# Patient Record
Sex: Female | Born: 1950 | Race: White | Hispanic: No | Marital: Married | Smoking: Never smoker
Health system: Southern US, Community
[De-identification: ages and names within clinical notes are randomized; demographics above are authoritative.]

## PROBLEM LIST (undated history)

## (undated) DIAGNOSIS — Z2989 Encounter for other specified prophylactic measures: Secondary | ICD-10-CM

## (undated) DIAGNOSIS — Z298 Encounter for other specified prophylactic measures: Secondary | ICD-10-CM

## (undated) HISTORY — DX: Encounter for other specified prophylactic measures: Z29.89

## (undated) HISTORY — DX: Encounter for other specified prophylactic measures: Z29.8

---

## 2000-03-10 ENCOUNTER — Encounter: Payer: Self-pay | Admitting: Internal Medicine

## 2000-03-10 ENCOUNTER — Ambulatory Visit (HOSPITAL_COMMUNITY): Admission: RE | Admit: 2000-03-10 | Discharge: 2000-03-10 | Payer: Self-pay | Admitting: Internal Medicine

## 2000-05-16 ENCOUNTER — Encounter: Admission: RE | Admit: 2000-05-16 | Discharge: 2000-05-16 | Payer: Self-pay | Admitting: Internal Medicine

## 2000-05-18 ENCOUNTER — Other Ambulatory Visit: Admission: RE | Admit: 2000-05-18 | Discharge: 2000-05-18 | Payer: Self-pay | Admitting: Obstetrics and Gynecology

## 2000-05-19 ENCOUNTER — Encounter (INDEPENDENT_AMBULATORY_CARE_PROVIDER_SITE_OTHER): Payer: Self-pay

## 2000-05-19 ENCOUNTER — Other Ambulatory Visit: Admission: RE | Admit: 2000-05-19 | Discharge: 2000-05-19 | Payer: Self-pay | Admitting: Obstetrics and Gynecology

## 2000-05-30 ENCOUNTER — Encounter: Admission: RE | Admit: 2000-05-30 | Discharge: 2000-05-30 | Payer: Self-pay | Admitting: Internal Medicine

## 2000-06-02 ENCOUNTER — Encounter: Payer: Self-pay | Admitting: Obstetrics and Gynecology

## 2000-06-02 ENCOUNTER — Ambulatory Visit (HOSPITAL_COMMUNITY): Admission: RE | Admit: 2000-06-02 | Discharge: 2000-06-02 | Payer: Self-pay | Admitting: Obstetrics and Gynecology

## 2000-09-14 ENCOUNTER — Ambulatory Visit (HOSPITAL_COMMUNITY): Admission: RE | Admit: 2000-09-14 | Discharge: 2000-09-14 | Payer: Self-pay | Admitting: Obstetrics and Gynecology

## 2000-09-14 ENCOUNTER — Encounter: Payer: Self-pay | Admitting: Obstetrics and Gynecology

## 2000-10-05 ENCOUNTER — Encounter: Payer: Self-pay | Admitting: Obstetrics and Gynecology

## 2000-10-05 ENCOUNTER — Other Ambulatory Visit: Admission: RE | Admit: 2000-10-05 | Discharge: 2000-10-05 | Payer: Self-pay | Admitting: Obstetrics and Gynecology

## 2000-10-05 ENCOUNTER — Encounter (INDEPENDENT_AMBULATORY_CARE_PROVIDER_SITE_OTHER): Payer: Self-pay

## 2000-10-05 ENCOUNTER — Encounter: Admission: RE | Admit: 2000-10-05 | Discharge: 2000-10-05 | Payer: Self-pay | Admitting: Obstetrics and Gynecology

## 2001-03-07 ENCOUNTER — Encounter: Admission: RE | Admit: 2001-03-07 | Discharge: 2001-03-07 | Payer: Self-pay | Admitting: Internal Medicine

## 2001-06-08 ENCOUNTER — Other Ambulatory Visit: Admission: RE | Admit: 2001-06-08 | Discharge: 2001-06-08 | Payer: Self-pay | Admitting: Obstetrics and Gynecology

## 2001-07-13 ENCOUNTER — Encounter: Payer: Self-pay | Admitting: Obstetrics and Gynecology

## 2001-07-13 ENCOUNTER — Encounter: Admission: RE | Admit: 2001-07-13 | Discharge: 2001-07-13 | Payer: Self-pay | Admitting: Obstetrics and Gynecology

## 2003-01-02 ENCOUNTER — Encounter: Admission: RE | Admit: 2003-01-02 | Discharge: 2003-01-02 | Payer: Self-pay | Admitting: Obstetrics and Gynecology

## 2003-01-02 ENCOUNTER — Encounter: Payer: Self-pay | Admitting: Obstetrics and Gynecology

## 2004-01-07 ENCOUNTER — Encounter: Admission: RE | Admit: 2004-01-07 | Discharge: 2004-01-07 | Payer: Self-pay | Admitting: Obstetrics and Gynecology

## 2005-03-11 ENCOUNTER — Encounter: Admission: RE | Admit: 2005-03-11 | Discharge: 2005-03-11 | Payer: Self-pay | Admitting: Obstetrics and Gynecology

## 2005-09-27 ENCOUNTER — Ambulatory Visit (HOSPITAL_COMMUNITY): Admission: RE | Admit: 2005-09-27 | Discharge: 2005-09-27 | Payer: Self-pay | Admitting: Neurology

## 2006-03-11 ENCOUNTER — Ambulatory Visit: Payer: Self-pay | Admitting: Internal Medicine

## 2006-03-17 ENCOUNTER — Ambulatory Visit: Payer: Self-pay | Admitting: Internal Medicine

## 2006-03-24 ENCOUNTER — Encounter: Admission: RE | Admit: 2006-03-24 | Discharge: 2006-03-24 | Payer: Self-pay | Admitting: Obstetrics and Gynecology

## 2006-03-30 ENCOUNTER — Other Ambulatory Visit: Admission: RE | Admit: 2006-03-30 | Discharge: 2006-03-30 | Payer: Self-pay | Admitting: Obstetrics and Gynecology

## 2006-10-17 ENCOUNTER — Ambulatory Visit: Payer: Self-pay | Admitting: Internal Medicine

## 2006-10-17 ENCOUNTER — Encounter (INDEPENDENT_AMBULATORY_CARE_PROVIDER_SITE_OTHER): Payer: Self-pay | Admitting: Hospitalist

## 2006-10-17 LAB — CONVERTED CEMR LAB
Hemoglobin: 12.8 g/dL (ref 12.0–15.0)
MCHC: 33.8 g/dL (ref 30.0–36.0)
RBC: 4.17 M/uL (ref 3.87–5.11)
Sed Rate: 5 mm/hr (ref 0–22)

## 2007-03-29 ENCOUNTER — Encounter: Admission: RE | Admit: 2007-03-29 | Discharge: 2007-03-29 | Payer: Self-pay | Admitting: Obstetrics and Gynecology

## 2007-07-19 ENCOUNTER — Ambulatory Visit (HOSPITAL_COMMUNITY): Admission: RE | Admit: 2007-07-19 | Discharge: 2007-07-19 | Payer: Self-pay | Admitting: Internal Medicine

## 2007-07-19 ENCOUNTER — Encounter: Payer: Self-pay | Admitting: Internal Medicine

## 2007-07-19 DIAGNOSIS — R079 Chest pain, unspecified: Secondary | ICD-10-CM | POA: Insufficient documentation

## 2007-09-08 ENCOUNTER — Ambulatory Visit: Payer: Self-pay | Admitting: Internal Medicine

## 2007-09-15 ENCOUNTER — Encounter: Admission: RE | Admit: 2007-09-15 | Discharge: 2007-09-15 | Payer: Self-pay | Admitting: Internal Medicine

## 2007-10-02 ENCOUNTER — Encounter (INDEPENDENT_AMBULATORY_CARE_PROVIDER_SITE_OTHER): Payer: Self-pay | Admitting: Internal Medicine

## 2008-04-03 LAB — HM COLONOSCOPY: HM Colonoscopy: NORMAL

## 2008-04-05 ENCOUNTER — Ambulatory Visit: Payer: Self-pay | Admitting: Internal Medicine

## 2008-04-05 LAB — CONVERTED CEMR LAB: Creatinine, Urine: 66.6 mg/dL

## 2008-04-10 ENCOUNTER — Encounter: Admission: RE | Admit: 2008-04-10 | Discharge: 2008-04-10 | Payer: Self-pay | Admitting: Obstetrics and Gynecology

## 2008-06-20 ENCOUNTER — Ambulatory Visit: Payer: Self-pay | Admitting: Internal Medicine

## 2008-06-20 DIAGNOSIS — M810 Age-related osteoporosis without current pathological fracture: Secondary | ICD-10-CM

## 2008-06-20 LAB — CONVERTED CEMR LAB: Creatinine, Urine: 57.7 mg/dL

## 2008-12-05 ENCOUNTER — Ambulatory Visit: Payer: Self-pay | Admitting: Internal Medicine

## 2008-12-05 LAB — CONVERTED CEMR LAB
HDL: 74 mg/dL (ref >39)
LDL Cholesterol: 92 mg/dL (ref 0–99)
Triglycerides: 134 mg/dL (ref <150)

## 2009-04-14 ENCOUNTER — Encounter: Admission: RE | Admit: 2009-04-14 | Discharge: 2009-04-14 | Payer: Self-pay | Admitting: Obstetrics and Gynecology

## 2009-11-17 ENCOUNTER — Encounter: Admission: RE | Admit: 2009-11-17 | Discharge: 2009-11-17 | Payer: Self-pay | Admitting: Obstetrics and Gynecology

## 2010-04-03 LAB — HM DEXA SCAN

## 2011-06-26 ENCOUNTER — Other Ambulatory Visit: Payer: Self-pay | Admitting: Internal Medicine

## 2011-06-26 DIAGNOSIS — Z23 Encounter for immunization: Secondary | ICD-10-CM

## 2011-06-26 MED ORDER — ZOSTER VACCINE LIVE 19400 UNT/0.65ML ~~LOC~~ SOLR: 0.6500 mL | Freq: Once | SUBCUTANEOUS | Status: DC

## 2011-10-05 ENCOUNTER — Ambulatory Visit: Payer: Self-pay | Admitting: Ophthalmology

## 2012-08-03 LAB — HM MAMMOGRAPHY: HM Mammogram: NORMAL

## 2012-11-01 ENCOUNTER — Encounter: Payer: Self-pay | Admitting: Internal Medicine

## 2013-04-03 ENCOUNTER — Ambulatory Visit (INDEPENDENT_AMBULATORY_CARE_PROVIDER_SITE_OTHER)
Admission: RE | Admit: 2013-04-03 | Discharge: 2013-04-03 | Disposition: A | Discharge status: Home / Self Care | Payer: PRIVATE HEALTH INSURANCE | Source: Ambulatory Visit | Attending: Internal Medicine | Admitting: Internal Medicine

## 2013-04-03 ENCOUNTER — Ambulatory Visit (INDEPENDENT_AMBULATORY_CARE_PROVIDER_SITE_OTHER)
Admission: RE | Admit: 2013-04-03 | Discharge: 2013-04-03 | Disposition: A | Discharge status: Home / Self Care | Payer: BC Managed Care – PPO | Source: Ambulatory Visit | Attending: Internal Medicine | Admitting: Internal Medicine

## 2013-04-03 ENCOUNTER — Encounter: Payer: Self-pay | Admitting: Internal Medicine

## 2013-04-03 ENCOUNTER — Ambulatory Visit (INDEPENDENT_AMBULATORY_CARE_PROVIDER_SITE_OTHER): Payer: PRIVATE HEALTH INSURANCE | Admitting: Internal Medicine

## 2013-04-03 VITALS — BP 118/66 | HR 64 | Temp 97.9°F | Resp 16 | Ht 67.0 in | Wt 141.8 lb

## 2013-04-03 DIAGNOSIS — Z1211 Encounter for screening for malignant neoplasm of colon: Secondary | ICD-10-CM

## 2013-04-03 DIAGNOSIS — M25549 Pain in joints of unspecified hand: Secondary | ICD-10-CM

## 2013-04-03 DIAGNOSIS — M25542 Pain in joints of left hand: Secondary | ICD-10-CM

## 2013-04-03 DIAGNOSIS — M25541 Pain in joints of right hand: Secondary | ICD-10-CM

## 2013-04-03 DIAGNOSIS — Z1239 Encounter for other screening for malignant neoplasm of breast: Secondary | ICD-10-CM

## 2013-04-03 DIAGNOSIS — M810 Age-related osteoporosis without current pathological fracture: Secondary | ICD-10-CM

## 2013-04-03 MED ORDER — HYDROCODONE-ACETAMINOPHEN 5-325 MG PO TABS: 1.0000 | ORAL_TABLET | Freq: Four times a day (QID) | ORAL | Status: DC | PRN

## 2013-04-03 NOTE — Patient Instructions (Addendum)
Thank you for coming today. 

## 2013-04-03 NOTE — Assessment & Plan Note (Addendum)
Managed for over 10 years with bisphosphonate therapy, now managed with Evista, by Ellie Lunch.  History of prior rib fractures and left humeral fracture. She has a strong FH of osteoporosis.Marland Kitchen

## 2013-04-04 ENCOUNTER — Encounter: Payer: Self-pay | Admitting: Internal Medicine

## 2013-04-04 ENCOUNTER — Telehealth: Payer: Self-pay | Admitting: Internal Medicine

## 2013-04-04 DIAGNOSIS — Z1211 Encounter for screening for malignant neoplasm of colon: Secondary | ICD-10-CM | POA: Insufficient documentation

## 2013-04-04 DIAGNOSIS — Z1239 Encounter for other screening for malignant neoplasm of breast: Secondary | ICD-10-CM | POA: Insufficient documentation

## 2013-04-04 NOTE — Assessment & Plan Note (Signed)
Normal colonoscopy in 2009 with 10 yr follow up advised by Lina Sar.  Recommending annual FOBTS for year 5 through 10

## 2013-04-04 NOTE — Assessment & Plan Note (Signed)
She has pain and stiffness in her DIP joints bilaterally and a cystic appearing area on her index finger that does not involve the joint.  Plain films of both hands are negative for erosive changes and show only changes consistent with OA.  Recommend prn use of NSAIDs

## 2013-04-04 NOTE — Progress Notes (Signed)
Patient ID: Amber Chambers, female   DOB: 07-Apr-1951, 62 y.o.   MRN: [id number]   Patient Active Problem List   Diagnosis Date Noted  . Breast cancer screening 04/04/2013  . Colon cancer screening 04/04/2013  . Joint pain in fingers of right hand 04/03/2013  . Osteoporosis, unspecified 06/20/2008  . RIB PAIN, RIGHT SIDED 07/19/2007    Subjective:  CC:   Chief Complaint  Patient presents with  . Follow-up    HPI:   Amber Chambers is a 62 y.o. female who presents as a new patient (last seen September 2011) to establish primary care with the chief complaint of  Joint pain. Patient has been noticing stiffness and enlargement of the distal IP joints of the fingers 2-4 on both hands for the last several months .  For the last two weeks has had a swollen area on the extensor surface of the tip of the index finger that abuts the cuticle.  The area is minimally tender and does not involve the joint and has grown slightly larger in size since she first noticed it. No history of insect bites, dog bites,  No fevers or rashes.  No history of trauma but does work in her yard gardening around [employer] and other flowering plants   Past Medical History  Diagnosis Date  . Osteoporosis prophylaxis     History reviewed. No pertinent past surgical history.  Family History  Problem Relation Age of Onset  . Cancer Mother   . Cancer Sister   . Early death Neg Hx   . Heart disease Neg Hx     History   Social History  . Marital Status: Married    Spouse Name: Sam Cykert    Number of Children: 2  . Years of Education: 20+   Occupational History  . physician    Social History Main Topics  . Smoking status: Never Smoker   . Smokeless tobacco: Never Used  . Alcohol Use: Yes  . Drug Use: No  . Sexually Active: Yes   Other Topics Concern  . Not on file   Social History Narrative   Physician, Palliative Care , [employer]   No Known Allergies   Review of Systems:   Patient denies headache,  fevers, malaise, unintentional weight loss, skin rash, eye pain, sinus congestion and sinus pain, sore throat, dysphagia,  hemoptysis , cough, dyspnea, wheezing, chest pain, palpitations, orthopnea, edema, abdominal pain, nausea, melena, diarrhea, constipation, flank pain, dysuria, hematuria, urinary  Frequency, nocturia, numbness, tingling, seizures,  Focal weakness, Loss of consciousness,  Tremor, insomnia, depression, anxiety, and suicidal ideation.    Objective:  BP 118/66  Pulse 64  Temp(Src) 97.9 F (36.6 C)  Resp 16  Ht 5\' 7"  (1.702 m)  Wt 141 lb 12 oz (64.297 kg)  BMI 22.2 kg/m2  SpO2 99%  General appearance: alert, cooperative and appears stated age Ears: normal TM's and external ear canals both ears Throat: lips, mucosa, and tongue normal; teeth and gums normal Neck: no adenopathy, no carotid bruit, supple, symmetrical, trachea midline and thyroid not enlarged, symmetric, no tenderness/mass/nodules Back: symmetric, no curvature. ROM normal. No CVA tenderness. Lungs: clear to auscultation bilaterally Heart: regular rate and rhythm, S1, S2 normal, no murmur, click, rub or gallop Abdomen: soft, non-tender; bowel sounds normal; no masses,  no organomegaly Pulses: 2+ and symmetric Skin: Skin color, texture, turgor normal. No rashes or lesions Lymph nodes: Cervical, supraclavicular, and axillary nodes normal.  Assessment and Plan:  Osteoporosis, unspecified  Managed for over 10 years with bisphosphonate therapy, now managed with Evista, by Ellie Lunch.  History of prior rib fractures and left humeral fracture. She has a strong FH of osteoporosis..   Joint pain in fingers of right hand She has pain and stiffness in her DIP joints bilaterally and a cystic appearing area on her index finger that does not involve the joint.  Plain films of both hands are negative for erosive changes and show only changes consistent with OA.  Recommend prn use of NSAIDs   Breast cancer screening With  a strong FH of BRCa,  Surveillance is managed by her gynecologist with annual mammograms,  Prior stereotactic breast biopsy in 1992 was benign.    Colon cancer screening Normal colonoscopy in 2009 with 10 yr follow up advised by Lina Sar.  Recommending annual FOBTS for year 5 through 10    Updated Medication List Outpatient Encounter Prescriptions as of 04/03/2013  Medication Sig Dispense Refill  . FLUoxetine (PROZAC) 10 MG capsule Take 10 mg by mouth daily.      . raloxifene (EVISTA) 60 MG tablet Take 60 mg by mouth daily.      Marland Kitchen zoster vaccine live, PF, (ZOSTAVAX) 40981 UNT/0.65ML injection Inject 19,400 Units into the skin once.  1 vial  0  . HYDROcodone-acetaminophen (NORCO/VICODIN) 5-325 MG per tablet Take 1 tablet by mouth every 6 (six) hours as needed for pain.  120 tablet  3   No facility-administered encounter medications on file as of 04/03/2013.     Orders Placed This Encounter  Procedures  . DG Hand Complete Left  . DG Hand Complete Right  . HM MAMMOGRAPHY  . HM DEXA SCAN  . HM PAP SMEAR  . POC Hemoccult Bld/Stl (3-Cd Home Screen)  . HM COLONOSCOPY    No Follow-up on file.

## 2013-04-04 NOTE — Assessment & Plan Note (Signed)
With a strong FH of BRCa,  Surveillance is managed by her gynecologist with annual mammograms,  Prior stereotactic breast biopsy in 1992 was benign.

## 2013-10-12 LAB — HM MAMMOGRAPHY: HM MAMMO: NORMAL

## 2013-10-22 ENCOUNTER — Encounter: Payer: Self-pay | Admitting: Internal Medicine

## 2013-11-30 ENCOUNTER — Other Ambulatory Visit: Payer: Self-pay | Admitting: Adult Health

## 2013-11-30 MED ORDER — OSELTAMIVIR PHOSPHATE 75 MG PO CAPS: 75.0000 mg | ORAL_CAPSULE | Freq: Two times a day (BID) | ORAL | Status: DC

## 2013-11-30 MED ORDER — AZITHROMYCIN 250 MG PO TABS: ORAL_TABLET | ORAL | Status: DC

## 2013-11-30 NOTE — Progress Notes (Signed)
Influenza household exposure. Now symtoms

## 2014-06-17 ENCOUNTER — Other Ambulatory Visit: Payer: Self-pay | Admitting: Internal Medicine

## 2014-06-17 MED ORDER — RALOXIFENE HCL 60 MG PO TABS: 60.0000 mg | ORAL_TABLET | Freq: Every day | ORAL | Status: DC

## 2014-09-12 ENCOUNTER — Encounter: Payer: Self-pay | Admitting: Internal Medicine

## 2014-09-12 ENCOUNTER — Encounter (INDEPENDENT_AMBULATORY_CARE_PROVIDER_SITE_OTHER): Payer: Self-pay

## 2014-09-12 ENCOUNTER — Ambulatory Visit (INDEPENDENT_AMBULATORY_CARE_PROVIDER_SITE_OTHER): Payer: BC Managed Care – PPO | Admitting: Internal Medicine

## 2014-09-12 VITALS — BP 120/68 | HR 55 | Temp 98.6°F | Resp 16 | Ht 67.0 in | Wt 149.8 lb

## 2014-09-12 DIAGNOSIS — M79641 Pain in right hand: Secondary | ICD-10-CM

## 2014-09-12 DIAGNOSIS — Z23 Encounter for immunization: Secondary | ICD-10-CM

## 2014-09-12 DIAGNOSIS — M25541 Pain in joints of right hand: Secondary | ICD-10-CM

## 2014-09-12 DIAGNOSIS — E876 Hypokalemia: Secondary | ICD-10-CM

## 2014-09-12 DIAGNOSIS — Z0000 Encounter for general adult medical examination without abnormal findings: Secondary | ICD-10-CM

## 2014-09-12 DIAGNOSIS — G2581 Restless legs syndrome: Secondary | ICD-10-CM

## 2014-09-12 DIAGNOSIS — E785 Hyperlipidemia, unspecified: Secondary | ICD-10-CM

## 2014-09-12 DIAGNOSIS — Z1239 Encounter for other screening for malignant neoplasm of breast: Secondary | ICD-10-CM

## 2014-09-12 DIAGNOSIS — M810 Age-related osteoporosis without current pathological fracture: Secondary | ICD-10-CM

## 2014-09-12 LAB — COMPREHENSIVE METABOLIC PANEL
ALT: 16 U/L (ref 0–35)
AST: 19 U/L (ref 0–37)
Albumin: 3.9 g/dL (ref 3.5–5.2)
Alkaline Phosphatase: 39 U/L (ref 39–117)
BUN: 17 mg/dL (ref 6–23)
CALCIUM: 9.2 mg/dL (ref 8.4–10.5)
CO2: 27 meq/L (ref 19–32)
CREATININE: 0.7 mg/dL (ref 0.4–1.2)
Chloride: 105 mEq/L (ref 96–112)
GFR: 86.29 mL/min (ref >60.00)
Glucose, Bld: 120 mg/dL — ABNORMAL HIGH (ref 70–99)
Potassium: 3.4 mEq/L — ABNORMAL LOW (ref 3.5–5.1)
Sodium: 140 mEq/L (ref 135–145)
Total Bilirubin: 0.5 mg/dL (ref 0.2–1.2)
Total Protein: 6.4 g/dL (ref 6.0–8.3)

## 2014-09-12 LAB — TSH: TSH: 0.94 u[IU]/mL (ref 0.35–4.50)

## 2014-09-12 LAB — VITAMIN D 25 HYDROXY (VIT D DEFICIENCY, FRACTURES): VITD: 33.73 ng/mL (ref 30.00–100.00)

## 2014-09-12 LAB — FERRITIN: Ferritin: 135.3 ng/mL (ref 10.0–291.0)

## 2014-09-12 MED ORDER — HYDROCODONE-ACETAMINOPHEN 5-325 MG PO TABS: 1.0000 | ORAL_TABLET | ORAL | Status: DC | PRN

## 2014-09-12 NOTE — Assessment & Plan Note (Addendum)
Managed for 10 years with bisphosphonate therapy previously , now managed with Evista, by Ellie LunchBhakti Paul.  History of prior rib fractures and left humeral fracture. She has a strong FH of osteoporosis..  Dr Renae FicklePaul [facility] dic[facility] who advise continuing Evista . Vit D level is normal. Discussed the current controversies surrounding the risks and benefits of calcium supplementation.  Encouraged her to increase dietary calcium through natural foods including almond/coconut milk
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
## 2014-09-12 NOTE — Assessment & Plan Note (Signed)
workjuyp was nwfgijed

## 2014-09-12 NOTE — Progress Notes (Signed)
Pre-visit discussion using our clinic review tool. No additional management support is needed unless otherwise documented below in the visit note.  

## 2014-09-12 NOTE — Patient Instructions (Addendum)
You had your annual  wellness exam today.    You can schedule  your mammogram at [facility] in December .   You received the TDaP vaccine and the Pneumovax vaccine today.  If you develop a sore arm , use tylenol and ibuprofen for a few days   We will contact you with the bloodwork results  I recommend getting the majority of your calcium and Vitamin D  through diet rather than supplements given the recent association of calcium supplements with increased coronary artery calcium scores (You need 1800 mg daily )   Unsweetened almond/coconut milk is a great low calorie , low carb, cholesterol free  way to increase your dietary calcium and vitamin D.  Try the blue [facility] Maintenance Adopting a healthy lifestyle and getting preventive care can go a long way to promote health and wellness. Talk with your health care provider about what schedule of regular examinations is right for you. This is a good chance for you to check in with your provider about disease prevention and staying healthy. In between checkups, there are plenty of things you can do on your own. Experts have done a lot of research about which lifestyle changes and preventive measures are most likely to keep you healthy. Ask your health care provider for more information. WEIGHT AND DIET  Eat a healthy diet  Be sure to include plenty of vegetables, fruits, low-fat dairy products, and lean protein.  Do not eat a lot of foods high in solid fats, added sugars, or salt.  Get regular exercise. This is one of the most important things you can do for your health.  Most adults should exercise for at least 150 minutes each week. The exercise should increase your heart rate and make you sweat (moderate-intensity exercise).  Most adults should also do strengthening exercises at least twice a week. This is in addition to the moderate-intensity exercise.  Maintain a healthy weight  Body mass index (BMI) is a measurement that can  be used to identify possible weight problems. It estimates body fat based on height and weight. Your health care provider can help determine your BMI and help you achieve or maintain a healthy weight.  For females 81 years of age and older:   A BMI below 18.5 is considered underweight.  A BMI of 18.5 to 24.9 is normal.  A BMI of 25 to 29.9 is considered overweight.  A BMI of 30 and above is considered obese.  Watch levels of cholesterol and blood lipids  You should start having your blood tested for lipids and cholesterol at 65 years of age, then have this test every 5 years.  You may need to have your cholesterol levels checked more often if:  Your lipid or cholesterol levels are high.  You are older than 65 years of age.  You are at high risk for heart disease.  CANCER SCREENING   Lung Cancer  Lung cancer screening is recommended for adults 42-54 years old who are at high risk for lung cancer because of a history of smoking.  A yearly low-dose CT scan of the lungs is recommended for people who:  Currently smoke.  Have quit within the past 15 years.  Have at least a 30-pack-year history of smoking. A pack year is smoking an average of one pack of cigarettes a day for 1 year.  Yearly screening should continue until it has been 15 years since you quit.  Yearly screening should  stop if you develop a health problem that would prevent you from having lung cancer treatment.  Breast Cancer  Practice breast self-awareness. This means understanding how your breasts normally appear and feel.  It also means doing regular breast self-exams. Let your health care provider know about any changes, no matter how small.  If you are in your 20s or 30s, you should have a clinical breast exam (CBE) by a health care provider every 1-3 years as part of a regular health exam.  If you are 59 or older, have a CBE every year. Also consider having a breast X-ray (mammogram) every year.  If  you have a family history of breast cancer, talk to your health care provider about genetic screening.  If you are at high risk for breast cancer, talk to your health care provider about having an MRI and a mammogram every year.  Breast cancer gene (BRCA) assessment is recommended for women who have family members with BRCA-related cancers. BRCA-related cancers include:  Breast.  Ovarian.  Tubal.  Peritoneal cancers.  Results of the assessment will determine the need for genetic counseling and BRCA1 and BRCA2 testing. Cervical Cancer Routine pelvic examinations to screen for cervical cancer are no longer recommended for nonpregnant women who are considered low risk for cancer of the pelvic organs (ovaries, uterus, and vagina) and who do not have symptoms. A pelvic examination may be necessary if you have symptoms including those associated with pelvic infections. Ask your health care provider if a screening pelvic exam is right for you.   The Pap test is the screening test for cervical cancer for women who are considered at risk.  If you had a hysterectomy for a problem that was not cancer or a condition that could lead to cancer, then you no longer need Pap tests.  If you are older than 65 years, and you have had normal Pap tests for the past 10 years, you no longer need to have Pap tests.  If you have had past treatment for cervical cancer or a condition that could lead to cancer, you need Pap tests and screening for cancer for at least 20 years after your treatment.  If you no longer get a Pap test, assess your risk factors if they change (such as having a new sexual partner). This can affect whether you should start being screened again.  Some women have medical problems that increase their chance of getting cervical cancer. If this is the case for you, your health care provider may recommend more frequent screening and Pap tests.  The human papillomavirus (HPV) test is another test  that may be used for cervical cancer screening. The HPV test looks for the virus that can cause cell changes in the cervix. The cells collected during the Pap test can be tested for HPV.  The HPV test can be used to screen women 31 years of age and older. Getting tested for HPV can extend the interval between normal Pap tests from three to five years.  An HPV test also should be used to screen women of any age who have unclear Pap test results.  After 65 years of age, women should have HPV testing as often as Pap tests.  Colorectal Cancer  This type of cancer can be detected and often prevented.  Routine colorectal cancer screening usually begins at 65 years of age and continues through 65 years of age.  Your health care provider may recommend screening at an earlier age  if you have risk factors for colon cancer.  Your health care provider may also recommend using home test kits to check for hidden blood in the stool.  A small camera at the end of a tube can be used to examine your colon directly (sigmoidoscopy or colonoscopy). This is done to check for the earliest forms of colorectal cancer.  Routine screening usually begins at age 10.  Direct examination of the colon should be repeated every 5-10 years through 65 years of age. However, you may need to be screened more often if early forms of precancerous polyps or small growths are found. Skin Cancer  Check your skin from head to toe regularly.  Tell your health care provider about any new moles or changes in moles, especially if there is a change in a mole's shape or color.  Also tell your health care provider if you have a mole that is larger than the size of a pencil eraser.  Always use sunscreen. Apply sunscreen liberally and repeatedly throughout the day.  Protect yourself by wearing long sleeves, pants, a wide-brimmed hat, and sunglasses whenever you are outside. HEART DISEASE, DIABETES, AND HIGH BLOOD PRESSURE   Have  your blood pressure checked at least every 1-2 years. High blood pressure causes heart disease and increases the risk of stroke.  If you are between 53 years and 51 years old, ask your health care provider if you should take aspirin to prevent strokes.  Have regular diabetes screenings. This involves taking a blood sample to check your fasting blood sugar level.  If you are at a normal weight and have a low risk for diabetes, have this test once every three years after 65 years of age.  If you are overweight and have a high risk for diabetes, consider being tested at a younger age or more often. PREVENTING INFECTION  Hepatitis B  If you have a higher risk for hepatitis B, you should be screened for this virus. You are considered at high risk for hepatitis B if:  You were born in a country where hepatitis B is common. Ask your health care provider which countries are considered high risk.  Your parents were born in a high-risk country, and you have not been immunized against hepatitis B (hepatitis B vaccine).  You have HIV or AIDS.  You use needles to inject street drugs.  You live with someone who has hepatitis B.  You have had sex with someone who has hepatitis B.  You get hemodialysis treatment.  You take certain medicines for conditions, including cancer, organ transplantation, and autoimmune conditions. Hepatitis C  Blood testing is recommended for:  Everyone born from [birth year] through 1967.  Anyone with known risk factors for hepatitis C. Sexually transmitted infections (STIs)  You should be screened for sexually transmitted infections (STIs) including gonorrhea and chlamydia if:  You are sexually active and are younger than 65 years of age.  You are older than 65 years of age and your health care provider tells you that you are at risk for this type of infection.  Your sexual activity has changed since you were last screened and you are at an increased risk for chlamydia  or gonorrhea. Ask your health care provider if you are at risk.  If you do not have HIV, but are at risk, it may be recommended that you take a prescription medicine daily to prevent HIV infection. This is called pre-exposure prophylaxis (PrEP). You are considered at risk if:  You are sexually active and do not regularly use condoms or know the HIV status of your partner(s).  You take drugs by injection.  You are sexually active with a partner who has HIV. Talk with your health care provider about whether you are at high risk of being infected with HIV. If you choose to begin PrEP, you should first be tested for HIV. You should then be tested every 3 months for as long as you are taking PrEP.  PREGNANCY   If you are premenopausal and you may become pregnant, ask your health care provider about preconception counseling.  If you may become pregnant, take 400 to 800 micrograms (mcg) of folic acid every day.  If you want to prevent pregnancy, talk to your health care provider about birth control (contraception). OSTEOPOROSIS AND MENOPAUSE   Osteoporosis is a disease in which the bones lose minerals and strength with aging. This can result in serious bone fractures. Your risk for osteoporosis can be identified using a bone density scan.  If you are 49 years of age or older, or if you are at risk for osteoporosis and fractures, ask your health care provider if you should be screened.  Ask your health care provider whether you should take a calcium or vitamin D supplement to lower your risk for osteoporosis.  Menopause may have certain physical symptoms and risks.  Hormone replacement therapy may reduce some of these symptoms and risks. Talk to your health care provider about whether hormone replacement therapy is right for you.  HOME CARE INSTRUCTIONS   Schedule regular health, dental, and eye exams.  Stay current with your immunizations.   Do not use any tobacco products including  cigarettes, chewing tobacco, or electronic cigarettes.  If you are pregnant, do not drink alcohol.  If you are breastfeeding, limit how much and how often you drink alcohol.  Limit alcohol intake to no more than 1 drink per day for nonpregnant women. One drink equals 12 ounces of beer, 5 ounces of wine, or 1 ounces of hard liquor.  Do not use street drugs.  Do not share needles.  Ask your health care provider for help if you need support or information about quitting drugs.  Tell your health care provider if you often feel depressed.  Tell your health care provider if you have ever been abused or do not feel safe at home. Document Released: 04/26/2011 Document Revised: 02/25/2014 Document Reviewed: 09/12/2013 [facility] Patient Information 2015 [place], [place]. This information is not intended to replace advice given to you by your health care provider. Make sure you discuss any questions you have with your health care provider.

## 2014-09-13 LAB — IRON AND TIBC
%SAT: 24 % (ref 20–55)
IRON: 62 ug/dL (ref 42–145)
TIBC: 254 ug/dL (ref 250–470)
UIBC: 192 ug/dL (ref 125–400)

## 2014-09-13 LAB — LIPID PANEL
CHOL/HDL RATIO: 3
CHOLESTEROL: 238 mg/dL — AB (ref 0–200)
HDL: 80.4 mg/dL (ref >39.00)
LDL CALC: 135 mg/dL — AB (ref 0–99)
NONHDL: 157.6
Triglycerides: 111 mg/dL (ref 0.0–149.0)
VLDL: 22.2 mg/dL (ref 0.0–40.0)

## 2014-09-14 ENCOUNTER — Encounter: Payer: Self-pay | Admitting: Internal Medicine

## 2014-09-14 DIAGNOSIS — Z0000 Encounter for general adult medical examination without abnormal findings: Secondary | ICD-10-CM | POA: Insufficient documentation

## 2014-09-14 DIAGNOSIS — G2581 Restless legs syndrome: Secondary | ICD-10-CM | POA: Insufficient documentation

## 2014-09-14 DIAGNOSIS — E876 Hypokalemia: Secondary | ICD-10-CM | POA: Insufficient documentation

## 2014-09-14 NOTE — Assessment & Plan Note (Signed)
[facility]

## 2014-09-14 NOTE — Assessment & Plan Note (Signed)

## 2014-09-14 NOTE — Assessment & Plan Note (Signed)
Mild, etiology unclear.  checking mg level.

## 2014-09-14 NOTE — Assessment & Plan Note (Signed)
Only occuring on weekdays,  With no history of low back pain .  Continue prn vicodin which relieves symptoms.  Iron studies are normal.

## 2014-09-15 NOTE — Addendum Note (Signed)
Addended by: Sherlene ShamsULLO, Rayshawn Maney L on: 09/15/2014 09:14 AM   Modules accepted: Orders

## 2014-09-16 ENCOUNTER — Encounter: Payer: Self-pay | Admitting: Internal Medicine

## 2014-09-16 ENCOUNTER — Other Ambulatory Visit (INDEPENDENT_AMBULATORY_CARE_PROVIDER_SITE_OTHER): Payer: BC Managed Care – PPO

## 2014-09-16 DIAGNOSIS — E876 Hypokalemia: Secondary | ICD-10-CM

## 2014-09-16 LAB — MAGNESIUM: MAGNESIUM: 2.1 mg/dL (ref 1.5–2.5)

## 2014-11-07 LAB — HM MAMMOGRAPHY

## 2015-02-22 IMAGING — CR DG HAND COMPLETE 3+V*L*
3 series · 3 of 3 positions shown · non-contrast
Comparison: None

CLINICAL DATA: Joint pain in the fingers.  Arthritis.  Nodules.

LEFT HAND - COMPLETE 3+ VIEW

[view not recorded (1 of 3)]
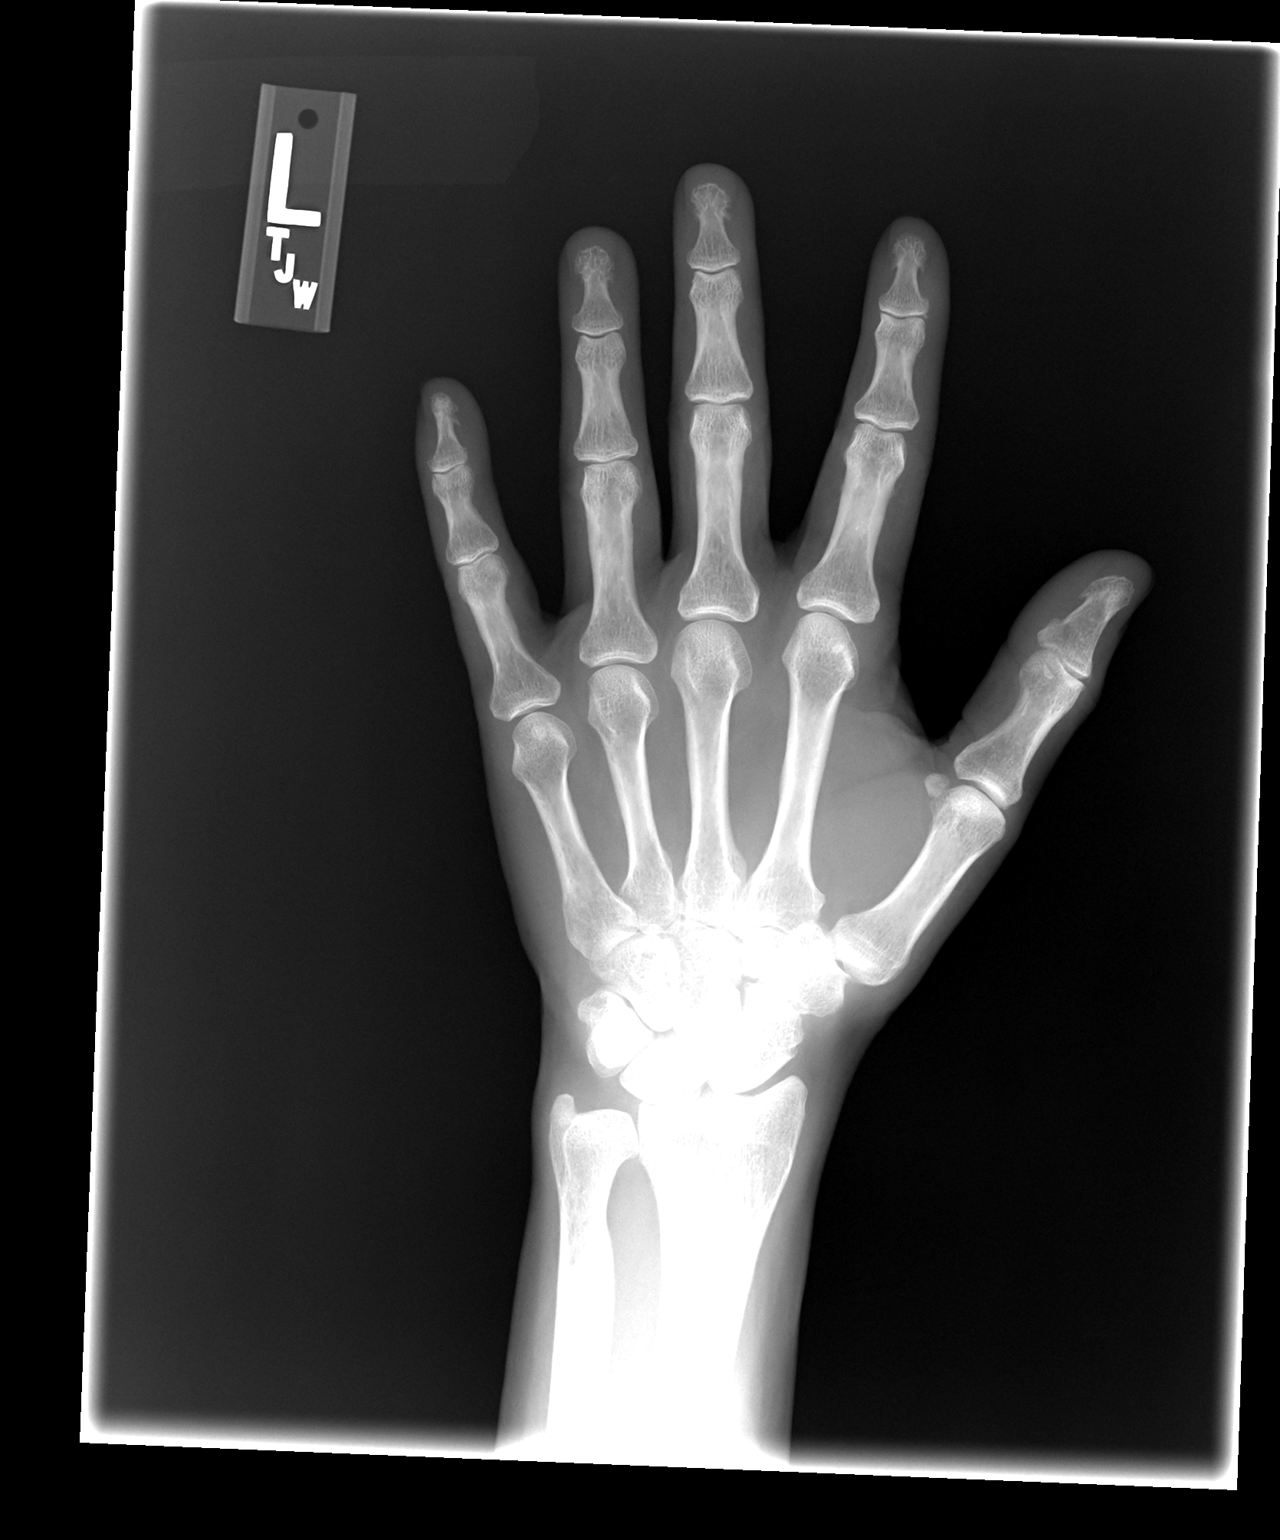

[view not recorded (2 of 3)]
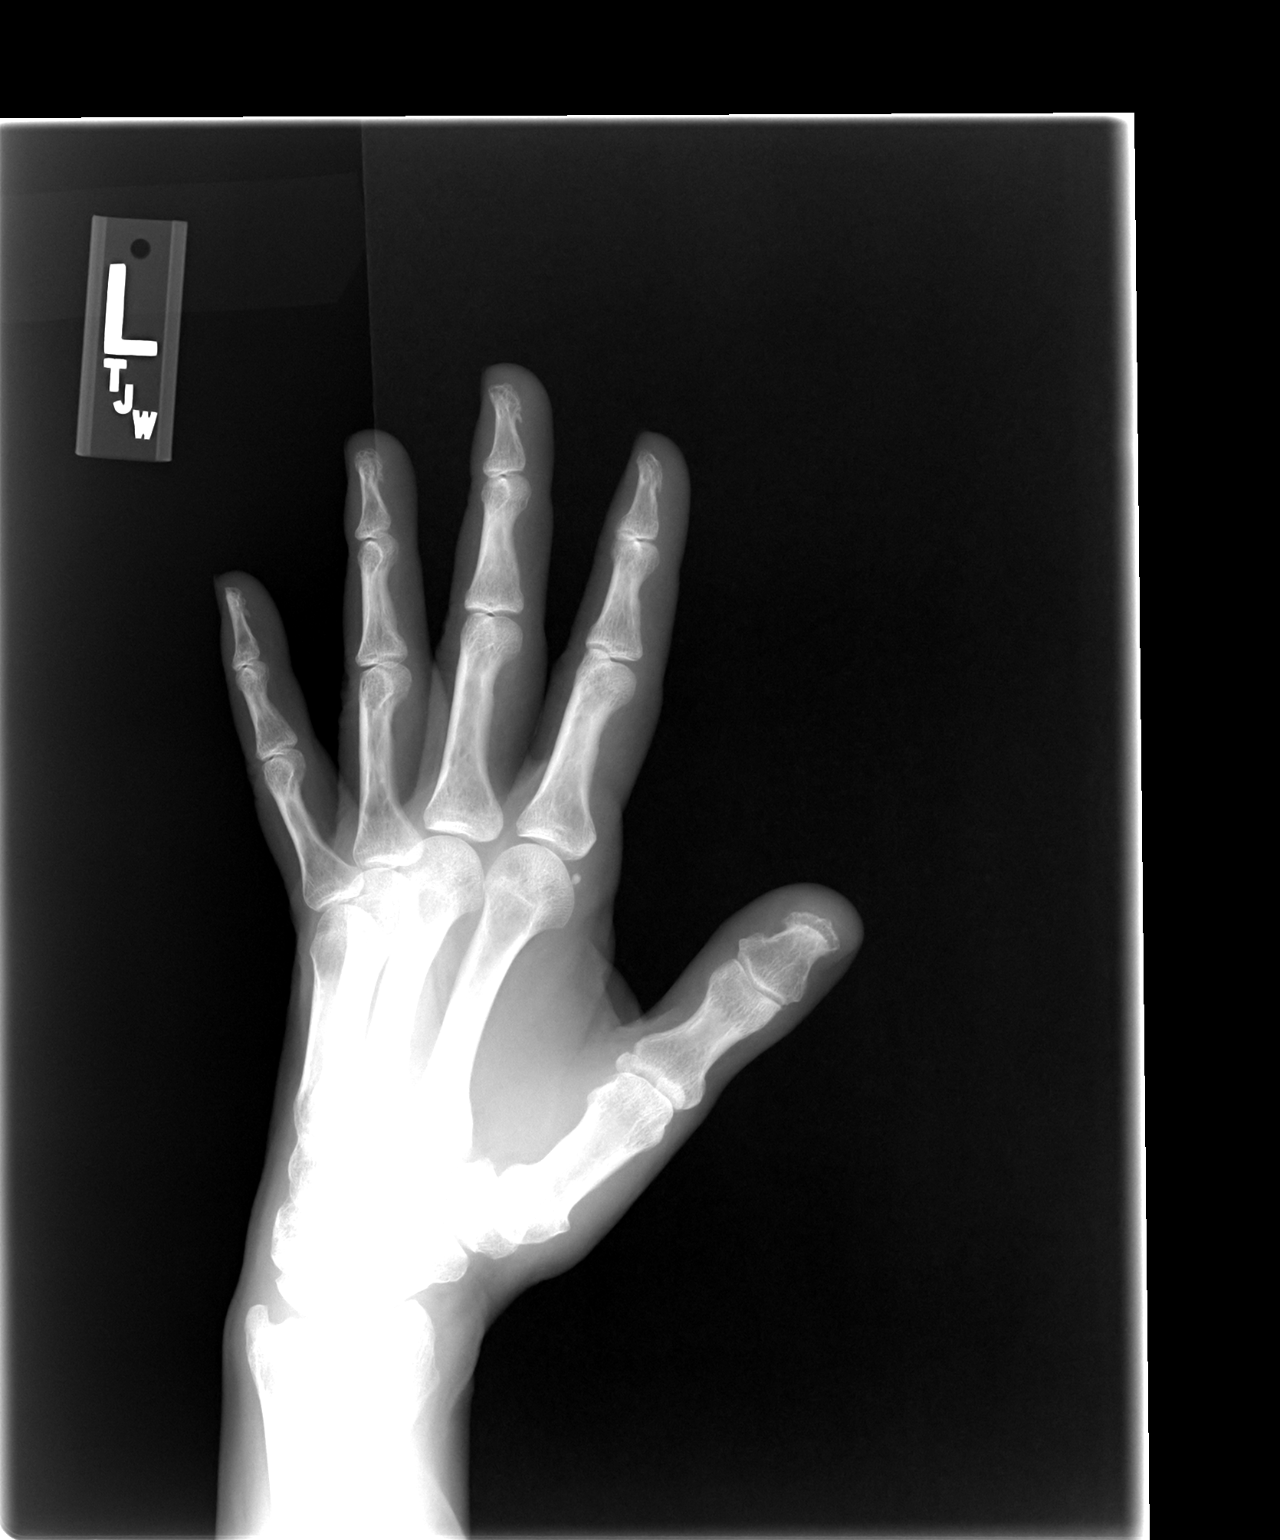

[view not recorded (3 of 3)]
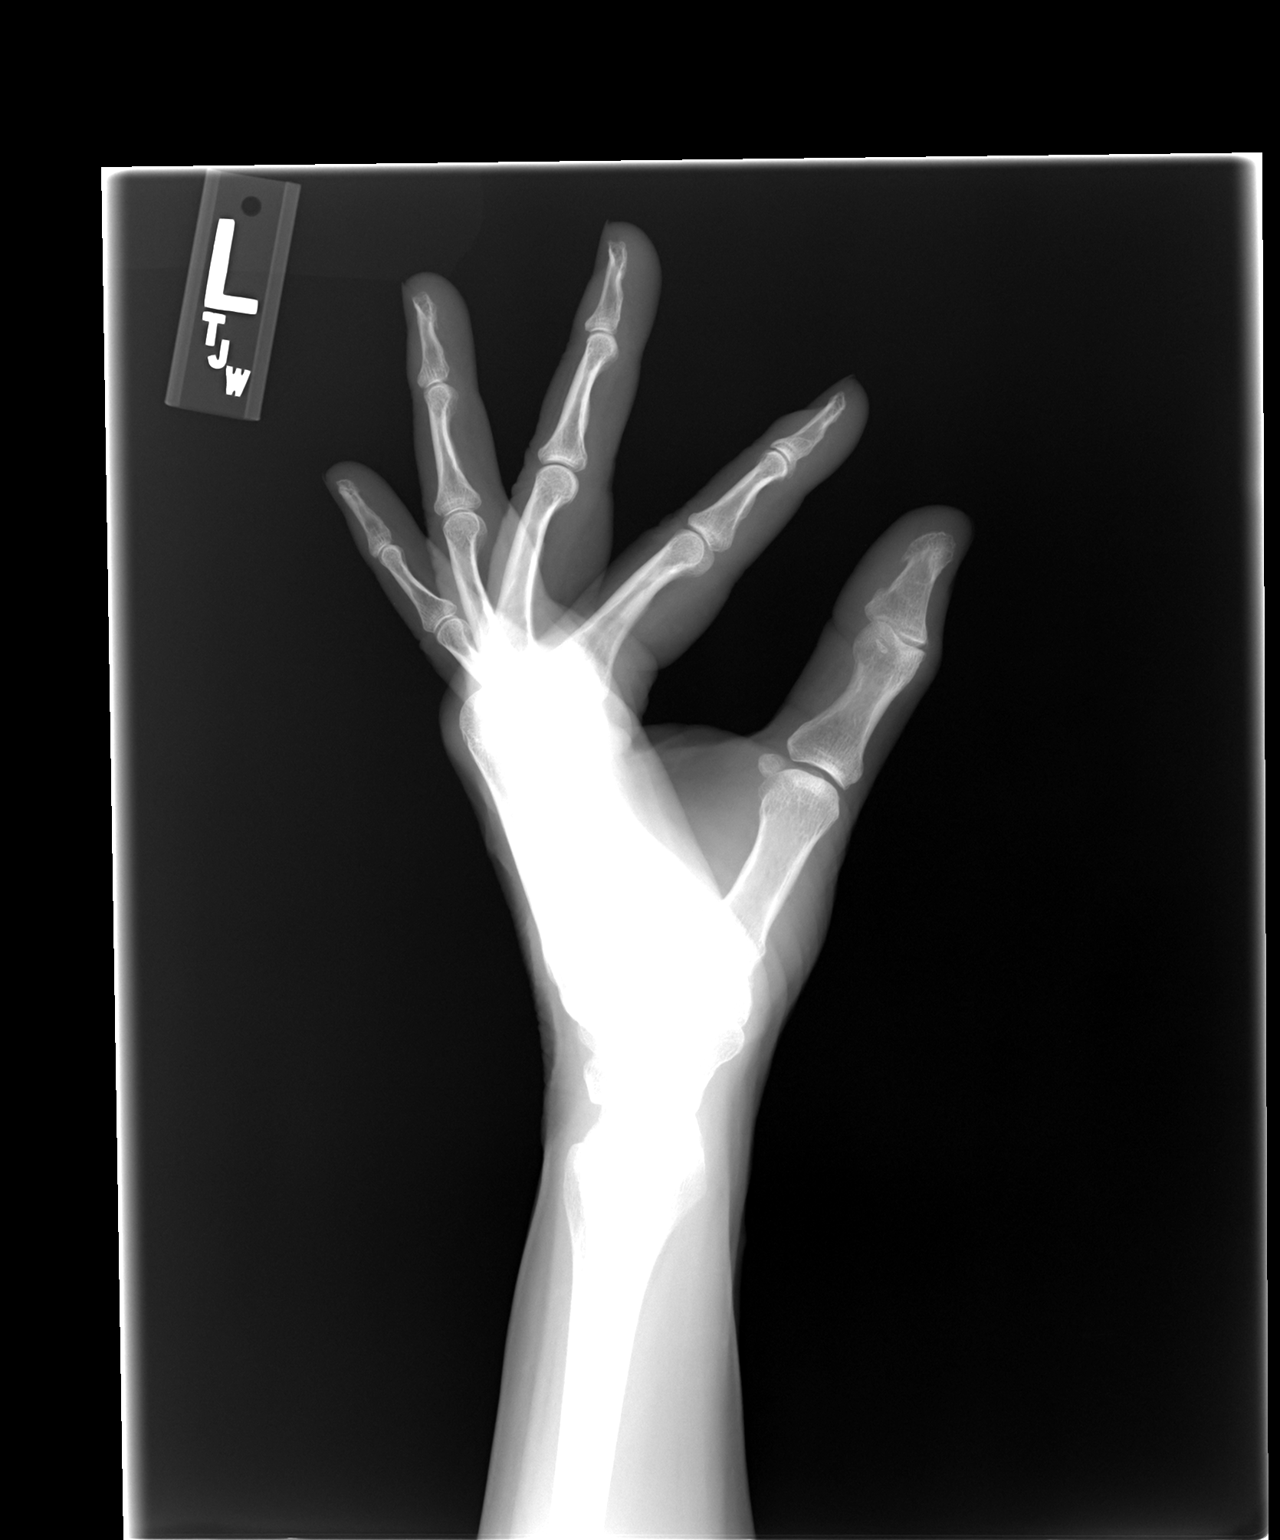

[3 of 3 positions shown; findings below may reference images not displayed]

FINDINGS: Minimal capsular calcification along the radial side of
the distal interphalangeal joint of the index finger no erosions
noted.  No significant articular space loss.  No tuft resorption.
IMPRESSION: 1.  Faint capsular calcification along the radial margin of the
distal interphalangeal joint of the index finger.  Otherwise
negative.

## 2015-06-12 ENCOUNTER — Other Ambulatory Visit: Payer: Self-pay | Admitting: Internal Medicine

## 2015-06-12 MED ORDER — RALOXIFENE HCL 60 MG PO TABS: 60.0000 mg | ORAL_TABLET | Freq: Every day | ORAL | Status: DC

## 2015-12-04 LAB — HM MAMMOGRAPHY: HM Mammogram: NEGATIVE

## 2015-12-11 ENCOUNTER — Encounter: Payer: Self-pay | Admitting: Internal Medicine

## 2016-02-19 ENCOUNTER — Encounter: Payer: Self-pay | Admitting: Gastroenterology

## 2016-05-12 ENCOUNTER — Encounter: Payer: Self-pay | Admitting: Internal Medicine

## 2016-05-12 ENCOUNTER — Ambulatory Visit (INDEPENDENT_AMBULATORY_CARE_PROVIDER_SITE_OTHER): Payer: BC Managed Care – PPO | Admitting: Internal Medicine

## 2016-05-12 VITALS — BP 110/66 | HR 63 | Temp 98.5°F | Resp 12 | Ht 67.0 in | Wt 132.8 lb

## 2016-05-12 DIAGNOSIS — Z0000 Encounter for general adult medical examination without abnormal findings: Secondary | ICD-10-CM | POA: Diagnosis not present

## 2016-05-12 DIAGNOSIS — Z23 Encounter for immunization: Secondary | ICD-10-CM

## 2016-05-12 DIAGNOSIS — R5383 Other fatigue: Secondary | ICD-10-CM

## 2016-05-12 DIAGNOSIS — E785 Hyperlipidemia, unspecified: Secondary | ICD-10-CM | POA: Diagnosis not present

## 2016-05-12 DIAGNOSIS — E559 Vitamin D deficiency, unspecified: Secondary | ICD-10-CM | POA: Diagnosis not present

## 2016-05-12 DIAGNOSIS — R739 Hyperglycemia, unspecified: Secondary | ICD-10-CM

## 2016-05-12 DIAGNOSIS — R634 Abnormal weight loss: Secondary | ICD-10-CM

## 2016-05-12 DIAGNOSIS — Z1211 Encounter for screening for malignant neoplasm of colon: Secondary | ICD-10-CM

## 2016-05-12 DIAGNOSIS — M810 Age-related osteoporosis without current pathological fracture: Secondary | ICD-10-CM

## 2016-05-12 LAB — LIPID PANEL
CHOL/HDL RATIO: 3
CHOLESTEROL: 221 mg/dL — AB (ref 0–200)
HDL: 75.9 mg/dL (ref >39.00)
LDL Cholesterol: 134 mg/dL — ABNORMAL HIGH (ref 0–99)
NonHDL: 145.4
TRIGLYCERIDES: 56 mg/dL (ref 0.0–149.0)
VLDL: 11.2 mg/dL (ref 0.0–40.0)

## 2016-05-12 LAB — COMPREHENSIVE METABOLIC PANEL
ALBUMIN: 4.2 g/dL (ref 3.5–5.2)
ALK PHOS: 33 U/L — AB (ref 39–117)
ALT: 12 U/L (ref 0–35)
AST: 18 U/L (ref 0–37)
BUN: 18 mg/dL (ref 6–23)
CALCIUM: 9.2 mg/dL (ref 8.4–10.5)
CO2: 30 mEq/L (ref 19–32)
Chloride: 102 mEq/L (ref 96–112)
Creatinine, Ser: 0.74 mg/dL (ref 0.40–1.20)
GFR: 83.18 mL/min (ref >60.00)
Glucose, Bld: 89 mg/dL (ref 70–99)
POTASSIUM: 3.6 meq/L (ref 3.5–5.1)
Sodium: 139 mEq/L (ref 135–145)
TOTAL PROTEIN: 7 g/dL (ref 6.0–8.3)
Total Bilirubin: 0.3 mg/dL (ref 0.2–1.2)

## 2016-05-12 LAB — VITAMIN D 25 HYDROXY (VIT D DEFICIENCY, FRACTURES): VITD: 38.02 ng/mL (ref 30.00–100.00)

## 2016-05-12 LAB — LDL CHOLESTEROL, DIRECT: LDL DIRECT: 136 mg/dL

## 2016-05-12 LAB — HEMOGLOBIN A1C: HEMOGLOBIN A1C: 5.8 % (ref 4.6–6.5)

## 2016-05-12 LAB — TSH: TSH: 1.17 u[IU]/mL (ref 0.35–4.50)

## 2016-05-12 MED ORDER — FLUOXETINE HCL 10 MG PO CAPS: 10.0000 mg | ORAL_CAPSULE | Freq: Every day | ORAL | Status: DC

## 2016-05-12 MED ORDER — CEPHALEXIN 500 MG PO CAPS: 500.0000 mg | ORAL_CAPSULE | Freq: Four times a day (QID) | ORAL | Status: DC

## 2016-05-12 MED ORDER — AZELAIC ACID 15 % EX GEL: CUTANEOUS | Status: AC

## 2016-05-12 NOTE — Progress Notes (Signed)
Pre-visit discussion using our clinic review tool. No additional management support is needed unless otherwise documented below in the visit note.  

## 2016-05-12 NOTE — Progress Notes (Signed)
Patient ID: Steele BergNancy W Chambers, female    DOB: September 04, 1951  Age: 65 y.o. MRN: [id number]  The patient is here for her annual wellness examination and management of other chronic and acute problems.     Dev[phone]  Colonoscopy needed Needs Prevnar  Osteoporosis,  On therapy.  Historyof rib fractures only. [facility] .  Last one 2011 Raloxifene and exercise  8 years.  Mammogram normal [facility] Feb 2017 dense breasts,  Prior biopsy remotely     The risk factors are reflected in the social history.  The roster of all physicians providing medical care to patient - is listed in the Snapshot section of the chart.  Activities of daily living:  The patient is 100% independent in all ADLs: dressing, toileting, feeding as well as independent mobility  Home safety : The patient has smoke detectors in the home. They wear seatbelts.  There are no firearms at home. There is no violence in the home.   There is no risks for hepatitis, STDs or HIV. There is no   history of blood transfusion. They have no travel history to infectious disease endemic areas of the world.  The patient has seen their dentist in the last six month. They have seen their eye doctor in the last year.    They have deferred audiologic testing in the last year.    They do not  have excessive sun exposure. Discussed the need for sun protection: hats, long sleeves and use of sunscreen if there is significant sun exposure.   Diet: the importance of a healthy diet is discussed. They do have a healthy diet.  The benefits of regular aerobic exercise were discussed. She exercises vigorously 5 days per week , 30 minutes   Depression screen: there are no signs or vegative symptoms of depression- irritability, change in appetite, anhedonia, sadness/tearfullness.  Cognitive assessment: the patient manages all their financial and personal affairs and  is actively engaged. They could relate day,date,year and events; recalled 2/3 objects at 3 minutes; performed clock-face test normally.  The following portions of the patient's history were reviewed and updated as appropriate: allergies, current medications, past family history, past medical history,  past surgical history, past social history  and problem list.  Visual acuity was not assessed per patient preference since she has regular follow up with her ophthalmologist. Hearing and body mass index were assessed and reviewed.   During the course of the visit the patient was educated and counseled about appropriate screening and preventive services including : fall prevention , diabetes screening, nutrition counseling, colorectal cancer screening, and recommended immunizations.    During the course of the visit , End of Life objectives were discussed at length,  Patient has a living will in place as well as a healthcare power of attorney.   CC: The primary encounter diagnosis was Other fatigue. Diagnoses of Vitamin D deficiency, Hyperlipidemia, Hyperglycemia, Osteoporosis, Need for prophylactic vaccination against Streptococcus pneumoniae (pneumococcus), Loss of weight, Encounter for preventive health examination, and Colon cancer screening were also pertinent to this visit.  History Amber Chambers has a past medical history of Osteoporosis prophylaxis.   She has no past surgical history on file.   Her family history includes Cancer in her mother and sister. There is no history of Early death or Heart disease.She reports that she has never smoked. She has never used smokeless tobacco.  She reports that she drinks alcohol. She reports that she does not use illicit drugs.  Outpatient Prescriptions Prior to Visit  Medication Sig Dispense Refill  . raloxifene (EVISTA) 60 MG tablet Take 1 tablet (60 mg total) by mouth daily. 90 tablet 3  . FLUoxetine (PROZAC) 10 MG capsule Take 10 mg by mouth daily.    Marland Kitchen  HYDROcodone-acetaminophen (NORCO/VICODIN) 5-325 MG per tablet Take 1 tablet by mouth every 4 (four) hours as needed. (Patient not taking: Reported on 05/12/2016) 180 tablet 0   No facility-administered medications prior to visit.    Review of Systems   Patient denies headache, fevers, malaise, unintentional weight loss, skin rash, eye pain, sinus congestion and sinus pain, sore throat, dysphagia,  hemoptysis , cough, dyspnea, wheezing, chest pain, palpitations, orthopnea, edema, abdominal pain, nausea, melena, diarrhea, constipation, flank pain, dysuria, hematuria, urinary  Frequency, nocturia, numbness, tingling, seizures,  Focal weakness, Loss of consciousness,  Tremor, insomnia, depression, anxiety, and suicidal ideation.      Objective:  BP 110/66 mmHg  Pulse 63  Temp(Src) 98.5 F (36.9 C) (Oral)  Resp 12  Ht  (1.702 m)  Wt 132 lb 12 oz (60.215 kg)  BMI 20.79 kg/m2  SpO2 97%  Physical Exam  General appearance: alert, cooperative and appears stated age Head: Normocephalic, without obvious abnormality, atraumatic Eyes: conjunctivae/corneas clear. PERRL, EOM's intact. Fundi benign. Ears: normal TM's and external ear canals both ears Nose: Nares normal. Septum midline. Mucosa normal. No drainage or sinus tenderness. Throat: lips, mucosa, and tongue normal; teeth and gums normal Neck: no adenopathy, no carotid bruit, no JVD, supple, symmetrical, trachea midline and thyroid not enlarged, symmetric, no tenderness/mass/nodules Lungs: clear to auscultation bilaterally Breasts: deferred per patiemt request.   Heart: regular rate and rhythm, S1, S2 normal, no murmur, click, rub or gallop Abdomen: soft, non-tender; bowel sounds normal; no masses,  no organomegaly Extremities: extremities normal, atraumatic, no cyanosis or edema Pulses: 2+ and symmetric Skin: Skin color, texture, turgor normal. No rashes or lesions Neurologic: Alert and oriented X 3, normal strength and tone. Normal  symmetric reflexes. Normal coordination and gait.   Assessment & Plan:   Problem List Items Addressed This Visit    Osteoporosis    Treated for year with alendronate followed by raloxifene,  Last DEXA 2011.  Referral to [facility] for follow up requested and DEXA will be repeated as well       Relevant Orders   DG Bone Density   Ambulatory referral to Endocrinology   Colon cancer screening    Normal colonoscopy 2007 by Lina Sar.  She is requesting referral to Dr Devona Konig at [facility] [phone]      Relevant Orders   Ambulatory referral to Gastroenterology   Encounter for preventive health examination    Annual comprehensive preventive exam was done as well as an evaluation and management of chronic conditions .  During the course of the visit the patient was educated and counseled about appropriate screening and preventive services including :  diabetes screening, lipid analysis with projected  10 year  risk for CAD , nutrition counseling, breast, cervical and colorectal cancer screening, and recommended immunizations.  Printed recommendations for health maintenance screenings was given      Loss of weight    Intentional,  Attributed initiation of daily exercise since retiring from [facility] a year ago. Screening TSH was normal and she is up to date on cancer screenings        Other  Visit Diagnoses    Other fatigue    -  Primary    Relevant Orders    Comprehensive metabolic panel (Completed)    TSH (Completed)    Vitamin D deficiency        Relevant Orders    VITAMIN D 25 Hydroxy (Vit-D Deficiency, Fractures) (Completed)    Hyperlipidemia        Relevant Orders    LDL cholesterol, direct (Completed)    Lipid panel (Completed)    Hyperglycemia        Relevant Orders    Hemoglobin A1c (Completed)    Need for prophylactic vaccination against Streptococcus pneumoniae (pneumococcus)        Relevant Orders    Pneumococcal conjugate vaccine 13-valent (Completed)       I  have discontinued Ms. Bellerose's HYDROcodone-acetaminophen. I have also changed her FLUoxetine. Additionally, I am having her start on Azelaic Acid and cephALEXin. Lastly, I am having her maintain her raloxifene.  Meds ordered this encounter  Medications  . Azelaic Acid 15 % cream    Sig: After skin is thoroughly washed and patted dry, gently but thoroughly massage a thin film of azelaic acid cream into the affected area twice daily, in the morning and evening.    Dispense:  30 g    Refill:  2  . FLUoxetine (PROZAC) 10 MG capsule    Sig: Take 1 capsule (10 mg total) by mouth daily.    Dispense:  90 capsule    Refill:  3    KEEP ON FILE FOR FUTURE REFILLS  . cephALEXin (KEFLEX) 500 MG capsule    Sig: Take 1 capsule (500 mg total) by mouth 4 (four) times daily.    Dispense:  28 capsule    Refill:  0    Medications Discontinued During This Encounter  Medication Reason  . HYDROcodone-acetaminophen (NORCO/VICODIN) 5-325 MG per tablet Patient Preference  . FLUoxetine (PROZAC) 10 MG capsule Reorder    Follow-up: No Follow-up on file.   Sherlene Shams, MD

## 2016-05-12 NOTE — Patient Instructions (Signed)

## 2016-05-13 ENCOUNTER — Other Ambulatory Visit (INDEPENDENT_AMBULATORY_CARE_PROVIDER_SITE_OTHER): Payer: BC Managed Care – PPO

## 2016-05-13 ENCOUNTER — Other Ambulatory Visit: Payer: Self-pay | Admitting: Internal Medicine

## 2016-05-13 DIAGNOSIS — R5383 Other fatigue: Secondary | ICD-10-CM

## 2016-05-13 LAB — CBC WITH DIFFERENTIAL/PLATELET
BASOS ABS: 0 10*3/uL (ref 0.0–0.1)
BASOS PCT: 0.3 % (ref 0.0–3.0)
Eosinophils Absolute: 1.6 10*3/uL — ABNORMAL HIGH (ref 0.0–0.7)
Eosinophils Relative: 17.4 % — ABNORMAL HIGH (ref 0.0–5.0)
HEMATOCRIT: 38.3 % (ref 36.0–46.0)
HEMOGLOBIN: 12.5 g/dL (ref 12.0–15.0)
LYMPHS PCT: 24.1 % (ref 12.0–46.0)
Lymphs Abs: 2.3 10*3/uL (ref 0.7–4.0)
MCHC: 32.7 g/dL (ref 30.0–36.0)
MCV: 92.4 fl (ref 78.0–100.0)
MONOS PCT: 6.6 % (ref 3.0–12.0)
Monocytes Absolute: 0.6 10*3/uL (ref 0.1–1.0)
NEUTROS ABS: 4.9 10*3/uL (ref 1.4–7.7)
Neutrophils Relative %: 51.6 % (ref 43.0–77.0)
PLATELETS: 334 10*3/uL (ref 150.0–400.0)
RBC: 4.15 Mil/uL (ref 3.87–5.11)
RDW: 14.4 % (ref 11.5–15.5)
WBC: 9.4 10*3/uL (ref 4.0–10.5)

## 2016-05-14 DIAGNOSIS — R634 Abnormal weight loss: Secondary | ICD-10-CM | POA: Insufficient documentation

## 2016-05-14 NOTE — Assessment & Plan Note (Addendum)
Intentional,  Attributed initiation of daily exercise since retiring from [facility] a year ago. Screening TSH was normal and she is up to date on cancer screenings

## 2016-05-15 ENCOUNTER — Encounter: Payer: Self-pay | Admitting: Internal Medicine

## 2016-05-15 NOTE — Assessment & Plan Note (Signed)
Normal colonoscopy 2007 by Lina Sar.  She is requesting referral to Dr Devona Konig at [facility] [phone]

## 2016-05-15 NOTE — Assessment & Plan Note (Signed)
Annual comprehensive preventive exam was done as well as an evaluation and management of chronic conditions .  During the course of the visit the patient was educated and counseled about appropriate screening and preventive services including :  diabetes screening, lipid analysis with projected  10 year  risk for CAD , nutrition counseling, breast, cervical and colorectal cancer screening, and recommended immunizations.  Printed recommendations for health maintenance screenings was given 

## 2016-05-15 NOTE — Assessment & Plan Note (Signed)
Treated for year with alendronate followed by raloxifene,  Last DEXA 2011.  Referral to [facility] Endocrinology for follow up requested and DEXA will be repeated as well

## 2016-05-16 ENCOUNTER — Encounter: Payer: Self-pay | Admitting: Internal Medicine

## 2016-05-17 ENCOUNTER — Other Ambulatory Visit: Payer: Self-pay | Admitting: Internal Medicine

## 2016-05-17 DIAGNOSIS — D721 Eosinophilia, unspecified: Secondary | ICD-10-CM

## 2016-06-07 ENCOUNTER — Encounter: Payer: Self-pay | Admitting: Internal Medicine

## 2016-06-10 ENCOUNTER — Telehealth: Payer: Self-pay | Admitting: Internal Medicine

## 2016-06-10 ENCOUNTER — Encounter: Payer: Self-pay | Admitting: Internal Medicine

## 2016-06-11 ENCOUNTER — Other Ambulatory Visit: Payer: Self-pay | Admitting: Internal Medicine

## 2016-06-12 ENCOUNTER — Other Ambulatory Visit: Payer: Self-pay | Admitting: Internal Medicine

## 2016-06-12 MED ORDER — RALOXIFENE HCL 60 MG PO TABS: 60.0000 mg | ORAL_TABLET | Freq: Every day | ORAL | 3 refills | Status: AC

## 2016-11-03 ENCOUNTER — Encounter: Payer: Self-pay | Admitting: Internal Medicine

## 2016-11-03 DIAGNOSIS — Z1159 Encounter for screening for other viral diseases: Secondary | ICD-10-CM

## 2016-11-04 ENCOUNTER — Telehealth: Payer: Self-pay | Admitting: Internal Medicine

## 2016-11-04 DIAGNOSIS — Z1239 Encounter for other screening for malignant neoplasm of breast: Secondary | ICD-10-CM

## 2016-11-04 NOTE — Telephone Encounter (Signed)
Dr Brosseau needs a non fasting lab appointment .  Am also ordering her mammogram

## 2016-11-04 NOTE — Telephone Encounter (Signed)
I have updated Flu shot, Patient has not had the Hep c, I have pended in Chart Scheduled lab for 11/09/16 at 11.30 have not  Notified patient yet thought you want to Email. Since question of Hep c recommendation.

## 2016-11-05 NOTE — Telephone Encounter (Signed)
Per chart, they are both active orders, are they not?

## 2016-11-05 NOTE — Telephone Encounter (Signed)
Spoke with patient she states she is scheduling [place] own mamm[place] in [place].  She is scheduled for labs on 11/08/16 she is wondering if she can have a CBC drawn that day she saw where a hepatitis c antibody was pending.  Please advise.

## 2016-11-05 NOTE — Telephone Encounter (Signed)
Orders in chart.

## 2016-11-08 ENCOUNTER — Telehealth: Payer: Self-pay | Admitting: Radiology

## 2016-11-08 ENCOUNTER — Other Ambulatory Visit (INDEPENDENT_AMBULATORY_CARE_PROVIDER_SITE_OTHER): Payer: BC Managed Care – PPO

## 2016-11-08 ENCOUNTER — Other Ambulatory Visit: Payer: Self-pay | Admitting: Internal Medicine

## 2016-11-08 DIAGNOSIS — D721 Eosinophilia, unspecified: Secondary | ICD-10-CM

## 2016-11-08 DIAGNOSIS — Z1159 Encounter for screening for other viral diseases: Secondary | ICD-10-CM

## 2016-11-08 DIAGNOSIS — R5383 Other fatigue: Secondary | ICD-10-CM

## 2016-11-08 LAB — CBC WITH DIFFERENTIAL/PLATELET
Basophils Absolute: 0 10*3/uL (ref 0.0–0.1)
Basophils Relative: 0.2 % (ref 0.0–3.0)
EOS PCT: 26.5 % — AB (ref 0.0–5.0)
Eosinophils Absolute: 2.8 10*3/uL — ABNORMAL HIGH (ref 0.0–0.7)
HCT: 37.2 % (ref 36.0–46.0)
Hemoglobin: 12.5 g/dL (ref 12.0–15.0)
LYMPHS ABS: 2.2 10*3/uL (ref 0.7–4.0)
Lymphocytes Relative: 20.9 % (ref 12.0–46.0)
MCHC: 33.7 g/dL (ref 30.0–36.0)
MCV: 93.3 fl (ref 78.0–100.0)
MONOS PCT: 6.2 % (ref 3.0–12.0)
Monocytes Absolute: 0.7 10*3/uL (ref 0.1–1.0)
NEUTROS PCT: 46.2 % (ref 43.0–77.0)
Neutro Abs: 4.9 10*3/uL (ref 1.4–7.7)
Platelets: 340 10*3/uL (ref 150.0–400.0)
RBC: 3.99 Mil/uL (ref 3.87–5.11)
RDW: 14.4 % (ref 11.5–15.5)
WBC: 10.5 10*3/uL (ref 4.0–10.5)

## 2016-11-08 NOTE — Telephone Encounter (Signed)
IT IS IN THERE ALREADY I SEE IT PENDING

## 2016-11-08 NOTE — Telephone Encounter (Signed)
Ok thank you. Please place Hep C order thank you.

## 2016-11-08 NOTE — Telephone Encounter (Signed)
Yes,  That's why I did not order it again.  Hep c and cbc . thanks

## 2016-11-08 NOTE — Telephone Encounter (Signed)
Pt coming in for labs tomorrow, there is an old order for CBC draw that was ordered on 05/19/16. Ok to use? Please place lab orders if not ok to use tomorrow. Thank you.

## 2016-11-09 ENCOUNTER — Other Ambulatory Visit: Payer: BC Managed Care – PPO

## 2016-11-09 ENCOUNTER — Encounter: Payer: Self-pay | Admitting: Internal Medicine

## 2016-11-09 LAB — HEPATITIS C ANTIBODY: HCV Ab: NEGATIVE

## 2016-12-07 LAB — HM MAMMOGRAPHY

## 2017-02-28 ENCOUNTER — Ambulatory Visit (INDEPENDENT_AMBULATORY_CARE_PROVIDER_SITE_OTHER): Payer: BC Managed Care – PPO | Admitting: Internal Medicine

## 2017-02-28 VITALS — BP 110/60 | HR 63 | Temp 98.1°F | Resp 15 | Ht 67.0 in | Wt 132.4 lb

## 2017-02-28 DIAGNOSIS — M810 Age-related osteoporosis without current pathological fracture: Secondary | ICD-10-CM | POA: Diagnosis not present

## 2017-02-28 DIAGNOSIS — F411 Generalized anxiety disorder: Secondary | ICD-10-CM | POA: Diagnosis not present

## 2017-02-28 DIAGNOSIS — D721 Eosinophilia, unspecified: Secondary | ICD-10-CM

## 2017-02-28 DIAGNOSIS — G453 Amaurosis fugax: Secondary | ICD-10-CM | POA: Diagnosis not present

## 2017-02-28 LAB — BASIC METABOLIC PANEL
BUN: 22 mg/dL — AB (ref 4–21)
Creatinine: 0.9 mg/dL (ref 0.5–1.1)
GLUCOSE: 93 mg/dL
Potassium: 4.4 mmol/L (ref 3.4–5.3)
Sodium: 143 mmol/L (ref 137–147)

## 2017-02-28 LAB — VITAMIN B12: VITAMIN B 12: 404

## 2017-02-28 LAB — HEPATIC FUNCTION PANEL
ALK PHOS: 42 U/L (ref 25–125)
ALT: 13 U/L (ref 7–35)
AST: 20 U/L (ref 13–35)
Bilirubin, Total: 0.2 mg/dL

## 2017-02-28 LAB — CBC AND DIFFERENTIAL
HCT: 39 % (ref 36–46)
Hemoglobin: 12.4 g/dL (ref 12.0–16.0)
NEUTROS ABS: 5 /uL
Platelets: 349 10*3/uL (ref 150–399)
WBC: 9.3 10^3/mL

## 2017-02-28 LAB — POCT ERYTHROCYTE SEDIMENTATION RATE, NON-AUTOMATED: SED RATE: 6 mm

## 2017-02-28 MED ORDER — FLUOXETINE HCL 20 MG PO CAPS
20.0000 mg | ORAL_CAPSULE | Freq: Every day | ORAL | 1 refills | Status: DC
Start: 2017-02-28 — End: 2017-08-31

## 2017-02-28 NOTE — Progress Notes (Signed)
Pre visit review using our clinic review tool, if applicable. No additional management support is needed unless otherwise documented below in the visit note. 
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
## 2017-02-28 NOTE — Patient Instructions (Addendum)
ECANo plaque visualized.  Left: CCA prox No plaque visualized. CCA midNo plaque visualized. CCA dist No plaque visualized. ICA prox 10-20% narrowing by image. Plaque at proximal appears focal,  heterogenous and irregular. ECANo plaque visualized.  Final Interpretation Right Carotid: The extracranial vessels were near-normal with only minimal wall  thickening or plaque. Left Carotid: There is evidence in the ICA of a less than 40% stenosis. Vertebrals:Both vertebral arteries were patent with antegrade flow. 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 Normal  flow hemodynamics were seen in the left subclavian artery. Electronically signed by [id number] Jodell CiproBlair Keagy Dr. on 01/27/2017 at 11:28:19 AM.

## 2017-03-01 ENCOUNTER — Other Ambulatory Visit: Payer: Self-pay

## 2017-03-01 DIAGNOSIS — G453 Amaurosis fugax: Secondary | ICD-10-CM | POA: Insufficient documentation

## 2017-03-01 DIAGNOSIS — F411 Generalized anxiety disorder: Secondary | ICD-10-CM | POA: Insufficient documentation

## 2017-03-01 NOTE — Assessment & Plan Note (Signed)
Agree with increase in dose of prozac to 20 mg daily.  She has employed multiple nonpharmacologic strategies to manage symptoms including exercise and meditation.  .Marland Kitchen

## 2017-03-01 NOTE — Assessment & Plan Note (Addendum)
Symptoms lasted < 1 minute and were moncular.     Thus far evaluated  with a retinal exam and carotid dopplers. No MRI/MRA was done, no ESR (but done yesterday), as there were no sugns of vasculitis on exam per review of [facility] opthalmology notes.  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  Statin therapy and LD asa recommended.  She is contemplative, pending further workup   Of her eosinophilia

## 2017-03-01 NOTE — Assessment & Plan Note (Signed)
Managed with [facility] for nearly 10 years,  Patient desires to dc therapy and repeat DEXA I 1-2 years.  Does not want to consider alternative therapy with  [facility] at this time.  Her only fractures to date have been ribs, which were due to blunt trauma from a [employer] massage chair several years ago.

## 2017-03-01 NOTE — Assessment & Plan Note (Addendum)
Asymptomatic.  Repeat CBC done at [facility] on May 7 notes that her [facility] is 1.9K/uL ,  Down form 2.6 K/uL in January.  With normal B12, hgb, renal and hepatic function,  And normal ESR . dc [facility] and repeat CBC in 3 months.

## 2017-04-15 ENCOUNTER — Ambulatory Visit: Payer: BC Managed Care – PPO | Admitting: Internal Medicine

## 2017-06-01 ENCOUNTER — Telehealth: Payer: Self-pay | Admitting: Internal Medicine

## 2017-06-01 DIAGNOSIS — D721 Eosinophilia, unspecified: Secondary | ICD-10-CM

## 2017-06-02 LAB — CBC AND DIFFERENTIAL
HEMATOCRIT: 40 (ref 36–46)
Hemoglobin: 13.4 (ref 12.0–16.0)
NEUTROS ABS: 4
Platelets: 343 (ref 150–399)
WBC: 11.1

## 2017-06-06 ENCOUNTER — Other Ambulatory Visit: Payer: Self-pay

## 2017-06-08 ENCOUNTER — Other Ambulatory Visit: Payer: Self-pay | Admitting: Internal Medicine

## 2017-06-08 DIAGNOSIS — D721 Eosinophilia, unspecified: Secondary | ICD-10-CM

## 2017-07-20 NOTE — Telephone Encounter (Signed)
error 

## 2017-07-25 NOTE — Telephone Encounter (Signed)
Error

## 2017-07-25 NOTE — Telephone Encounter (Signed)
Orders

## 2017-08-08 ENCOUNTER — Encounter: Payer: Self-pay | Admitting: Internal Medicine

## 2017-08-31 ENCOUNTER — Other Ambulatory Visit: Payer: Self-pay | Admitting: Internal Medicine
